# Patient Record
Sex: Female | Born: 1935 | Race: White | Hispanic: No | Marital: Married
Health system: Southern US, Community
[De-identification: ages and names within clinical notes are randomized; demographics above are authoritative.]

## PROBLEM LIST (undated history)

## (undated) DIAGNOSIS — E785 Hyperlipidemia, unspecified: Secondary | ICD-10-CM

## (undated) DIAGNOSIS — M4800 Spinal stenosis, site unspecified: Secondary | ICD-10-CM

## (undated) DIAGNOSIS — F0280 Dementia in other diseases classified elsewhere without behavioral disturbance: Secondary | ICD-10-CM

## (undated) DIAGNOSIS — N189 Chronic kidney disease, unspecified: Secondary | ICD-10-CM

---

## 2020-03-04 ENCOUNTER — Other Ambulatory Visit: Payer: Self-pay

## 2020-03-04 ENCOUNTER — Emergency Department (HOSPITAL_BASED_OUTPATIENT_CLINIC_OR_DEPARTMENT_OTHER)
Admission: EM | Admit: 2020-03-04 | Discharge: 2020-03-05 | Disposition: A | Discharge status: Home / Self Care | Payer: Medicare Other | Attending: Emergency Medicine | Admitting: Emergency Medicine

## 2020-03-04 ENCOUNTER — Emergency Department (HOSPITAL_BASED_OUTPATIENT_CLINIC_OR_DEPARTMENT_OTHER): Payer: Medicare Other

## 2020-03-04 ENCOUNTER — Encounter (HOSPITAL_BASED_OUTPATIENT_CLINIC_OR_DEPARTMENT_OTHER): Payer: Self-pay

## 2020-03-04 DIAGNOSIS — S0083XA Contusion of other part of head, initial encounter: Secondary | ICD-10-CM | POA: Diagnosis present

## 2020-03-04 DIAGNOSIS — S0003XA Contusion of scalp, initial encounter: Secondary | ICD-10-CM | POA: Insufficient documentation

## 2020-03-04 DIAGNOSIS — F0281 Dementia in other diseases classified elsewhere with behavioral disturbance: Secondary | ICD-10-CM | POA: Insufficient documentation

## 2020-03-04 DIAGNOSIS — Y9239 Other specified sports and athletic area as the place of occurrence of the external cause: Secondary | ICD-10-CM | POA: Insufficient documentation

## 2020-03-04 DIAGNOSIS — W1839XA Other fall on same level, initial encounter: Secondary | ICD-10-CM | POA: Insufficient documentation

## 2020-03-04 DIAGNOSIS — R300 Dysuria: Secondary | ICD-10-CM | POA: Insufficient documentation

## 2020-03-04 DIAGNOSIS — Y9389 Activity, other specified: Secondary | ICD-10-CM | POA: Diagnosis not present

## 2020-03-04 DIAGNOSIS — G309 Alzheimer's disease, unspecified: Secondary | ICD-10-CM | POA: Diagnosis not present

## 2020-03-04 DIAGNOSIS — W19XXXA Unspecified fall, initial encounter: Secondary | ICD-10-CM

## 2020-03-04 HISTORY — DX: Dementia in other diseases classified elsewhere, unspecified severity, without behavioral disturbance, psychotic disturbance, mood disturbance, and anxiety: F02.80

## 2020-03-04 HISTORY — DX: Chronic kidney disease, unspecified: N18.9

## 2020-03-04 HISTORY — DX: Spinal stenosis, site unspecified: M48.00

## 2020-03-04 HISTORY — DX: Hyperlipidemia, unspecified: E78.5

## 2020-03-04 LAB — URINALYSIS, MICROSCOPIC (REFLEX): RBC / HPF: NONE SEEN RBC/hpf (ref 0–5)

## 2020-03-04 LAB — URINALYSIS, ROUTINE W REFLEX MICROSCOPIC
Bilirubin Urine: NEGATIVE
Glucose, UA: NEGATIVE mg/dL
Hgb urine dipstick: NEGATIVE
Ketones, ur: NEGATIVE mg/dL
Leukocytes,Ua: NEGATIVE
Nitrite: NEGATIVE
Protein, ur: 30 mg/dL — AB
Specific Gravity, Urine: >1.03 — ABNORMAL HIGH (ref 1.005–1.030)
pH: 6 (ref 5.0–8.0)

## 2020-03-04 NOTE — ED Provider Notes (Signed)
Emergency Department Provider Note   I have reviewed the triage vital signs and the nursing notes.   HISTORY  Chief Complaint Fall and Urinary Tract Infection   HPI Madeline Richardson is a 86 y.o. female with PMH of dementia from [facility] memory care unit presents to the ED after being found down on the ground.  Patient has dementia and does not recall the incident.  Staff found her on the floor and noted a hematoma to the forehead.  EMS was called for transport.  Per EMS, staff requesting evaluation for urinary tract infection but no report of fever or other specific symptoms.  Patient is reportedly at her mental status baseline per EMS.   Level 5 caveat: Dementia   Past Medical History:  Diagnosis Date  . Alzheimer's dementia (HCC)   . Chronic kidney disease   . Hyperlipidemia   . Spinal stenosis     There are no problems to display for this patient.  Allergies Patient has no known allergies.  No family history on file.  Social History Social History   Tobacco Use  . Smoking status: Unknown If Ever Smoked  Vaping Use  . Vaping Use: Unknown  Substance Use Topics  . Alcohol use: Not on file  . Drug use: Not on file    Review of Systems  Level 5 caveat: Dementia  ____________________________________________   PHYSICAL EXAM:  VITAL SIGNS: ED Triage Vitals  Enc Vitals Group     BP 03/04/20 2103 (!) 153/78     Pulse Rate 03/04/20 2103 65     Resp 03/04/20 2103 16     Temp 03/04/20 2103 98.1 F (36.7 C)     Temp Source 03/04/20 2103 Oral     SpO2 03/04/20 2059 95 %   Constitutional: Alert and pleasantly confused. Well appearing and in no acute distress. Eyes: Conjunctivae are normal. Head: 3 x 4 cm hematoma without laceration to the right forehead.  Nose: No congestion/rhinnorhea. Mouth/Throat: Mucous membranes are moist.   Neck: No stridor.   Cardiovascular: Normal rate, regular rhythm. Good peripheral circulation. Grossly normal heart sounds.     Respiratory: Normal respiratory effort.  No retractions. Lungs CTAB. Gastrointestinal: Soft and nontender. No distention.  Musculoskeletal: No lower extremity tenderness nor edema. No gross deformities of extremities. Normal active and passive ROM of bilateral upper and lower extremities including hips.  Neurologic:  Normal speech and language. No gross focal neurologic deficits are appreciated.  Skin:  Skin is warm, dry and intact. No rash noted.   ____________________________________________   LABS (all labs ordered are listed, but only abnormal results are displayed)  Labs Reviewed  URINALYSIS, ROUTINE W REFLEX MICROSCOPIC - Abnormal; Notable for the following components:      Result Value   APPearance HAZY (*)    Specific Gravity, Urine >1.030 (*)    Protein, ur 30 (*)    All other components within normal limits  URINALYSIS, MICROSCOPIC (REFLEX) - Abnormal; Notable for the following components:   Bacteria, UA FEW (*)    All other components within normal limits  URINE CULTURE   ____________________________________________  RADIOLOGY  CT Head Wo Contrast  Result Date: 03/04/2020 CLINICAL DATA:  86 year old female with head trauma. EXAM: CT HEAD WITHOUT CONTRAST CT CERVICAL SPINE WITHOUT CONTRAST TECHNIQUE: Multidetector CT imaging of the head and cervical spine was performed following the standard protocol without intravenous contrast. Multiplanar CT image reconstructions of the cervical spine were also generated. COMPARISON:  None. FINDINGS: CT HEAD FINDINGS  Brain: Mild age-related atrophy and chronic microvascular ischemic changes. Postsurgical changes of the left temporal lobe with encephalomalacia. There is no acute intracranial hemorrhage. No mass effect midline shift no extra-axial fluid collection. Vascular: No hyperdense vessel or unexpected calcification. Skull: Left temporal craniotomy. No acute osseous pathology. Sinuses/Orbits: No acute finding. Other: Minimal right  forehead contusion. CT CERVICAL SPINE FINDINGS Alignment: No acute subluxation. Grade 1 C4-C5 anterolisthesis. Skull base and vertebrae: No acute fracture. Osteopenia. Soft tissues and spinal canal: No prevertebral fluid or swelling. No visible canal hematoma. Disc levels: Multilevel degenerative changes with endplate irregularity and disc space narrowing and osteophyte primarily at C5 C6 and C6-C7. Upper chest: Negative. Other: None IMPRESSION: 1. No acute intracranial pathology. 2. Mild age-related atrophy and chronic microvascular ischemic changes. Postsurgical changes of the left temporal lobe with encephalomalacia. 3. No acute/traumatic cervical spine pathology. Multilevel degenerative changes. Electronically Signed   By: Elgie Collard M.D.   On: 03/04/2020 21:43   CT Cervical Spine Wo Contrast  Result Date: 03/04/2020 CLINICAL DATA:  86 year old female with head trauma. EXAM: CT HEAD WITHOUT CONTRAST CT CERVICAL SPINE WITHOUT CONTRAST TECHNIQUE: Multidetector CT imaging of the head and cervical spine was performed following the standard protocol without intravenous contrast. Multiplanar CT image reconstructions of the cervical spine were also generated. COMPARISON:  None. FINDINGS: CT HEAD FINDINGS Brain: Mild age-related atrophy and chronic microvascular ischemic changes. Postsurgical changes of the left temporal lobe with encephalomalacia. There is no acute intracranial hemorrhage. No mass effect midline shift no extra-axial fluid collection. Vascular: No hyperdense vessel or unexpected calcification. Skull: Left temporal craniotomy. No acute osseous pathology. Sinuses/Orbits: No acute finding. Other: Minimal right forehead contusion. CT CERVICAL SPINE FINDINGS Alignment: No acute subluxation. Grade 1 C4-C5 anterolisthesis. Skull base and vertebrae: No acute fracture. Osteopenia. Soft tissues and spinal canal: No prevertebral fluid or swelling. No visible canal hematoma. Disc levels: Multilevel  degenerative changes with endplate irregularity and disc space narrowing and osteophyte primarily at C5 C6 and C6-C7. Upper chest: Negative. Other: None IMPRESSION: 1. No acute intracranial pathology. 2. Mild age-related atrophy and chronic microvascular ischemic changes. Postsurgical changes of the left temporal lobe with encephalomalacia. 3. No acute/traumatic cervical spine pathology. Multilevel degenerative changes. Electronically Signed   By: Elgie Collard M.D.   On: 03/04/2020 21:43    ____________________________________________   PROCEDURES  Procedure(s) performed:   Procedures  None  ____________________________________________   INITIAL IMPRESSION / ASSESSMENT AND PLAN / ED COURSE  Pertinent labs & imaging results that were available during my care of the patient were reviewed by me and considered in my medical decision making (see chart for details).   Patient presents to the emergency department for evaluation of fall at the memory care unit.  She has normal range of motion of her arms and legs without apparent discomfort.  She does have a hematoma to the forehead and will obtain CT scan of the head and cervical spine.  No other apparent areas of pain on exam.  We will also send UA.   CT imaging of the head and cervical spine reviewed with no acute findings.  Patient's UA is not consistent with UTI but will send for culture.  Patient appears to be at her mental status baseline.  Plan for discharge back to the memory care unit. Instructions and w/u results discussed in AVS for staff and family.  ____________________________________________  FINAL CLINICAL IMPRESSION(S) / ED DIAGNOSES  Final diagnoses:  Fall, initial encounter  Hematoma of scalp, initial encounter  Note:  This document was prepared using Dragon voice recognition software and may include unintentional dictation errors.  Alona Bene, MD, [facility]    Carisa Backhaus, Arlyss Repress, MD 03/04/20  [phone]

## 2020-03-04 NOTE — Discharge Instructions (Signed)
You were seen in the emergency room today after a fall.  The CT scan of your head and neck look normal.  He did not have a urinary tract infection but we are sending this for culture and you will be called if bacteria grow that require antibiotics.  Please follow closely with your primary care doctor.

## 2020-03-04 NOTE — ED Triage Notes (Signed)
Patient arrived via EMS c/o fall at facility. Per EMS patient found on floor of memory care unit of [place] at [facility] with hematoma on forehead. Patient has hx of dementia. Patient denies pain at this time. Per facility request, would like patient evaluated for UTI. Patient is AO at baseline, w/ elevated BP, unsteady gait.

## 2020-03-05 NOTE — ED Notes (Signed)
Patient has left hearing aide in ear; right hearing aide placed in specimen cup and labeled with patient's name on bottle and placed in belonging bag with shoes in bag and patient label on bag as well.

## 2020-03-05 NOTE — ED Notes (Signed)
Pt is awaiting transport back to her facility.

## 2020-03-06 LAB — URINE CULTURE: Culture: NO GROWTH

## 2020-07-31 DEATH — deceased

## 2022-05-05 IMAGING — CT CT CERVICAL SPINE W/O CM
3 of 4 series · 13 of 33 positions shown, 16 images · non-contrast
Comparison: None.

CLINICAL DATA: [AGE] female with head trauma.

EXAM:
CT HEAD WITHOUT CONTRAST
CT CERVICAL SPINE WITHOUT CONTRAST
TECHNIQUE: Multidetector CT imaging of the head and cervical spine was
performed following the standard protocol without intravenous
contrast. Multiplanar CT image reconstructions of the cervical spine
were also generated.

[Series 3: c_spine 2.0 i30s 3 · axial · 0.35mm/px · z∈[-196,-98]mm · 5 of 75 slices shown, 7 images]
[im 13/75  soft-tissue]
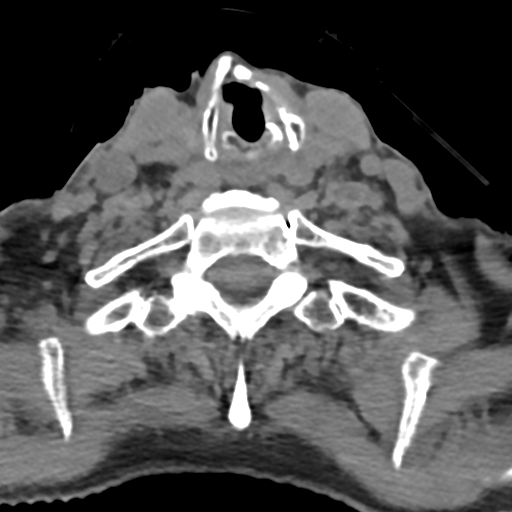
[im 13/75  bone]
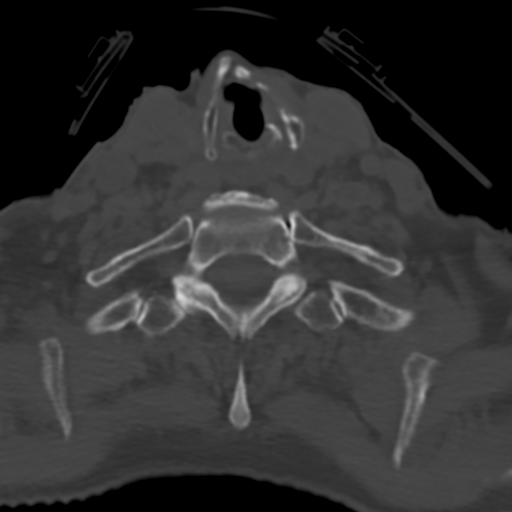
[im 25/75  bone]
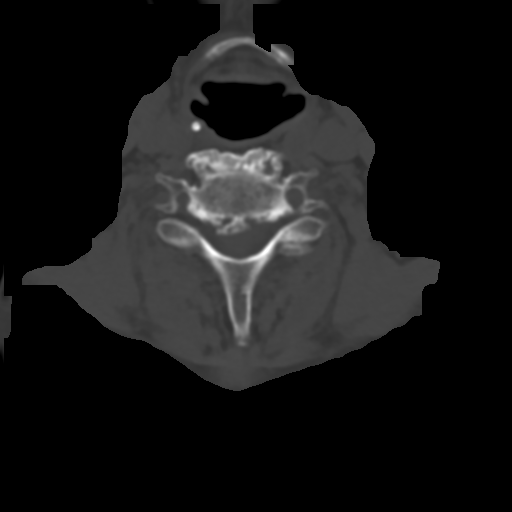
[im 38/75  bone]
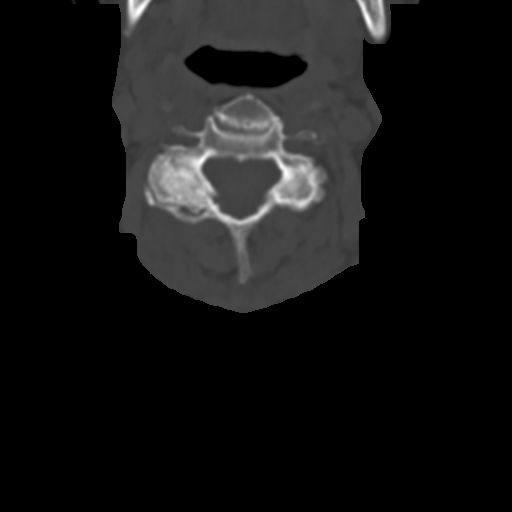
[im 50/75  bone]
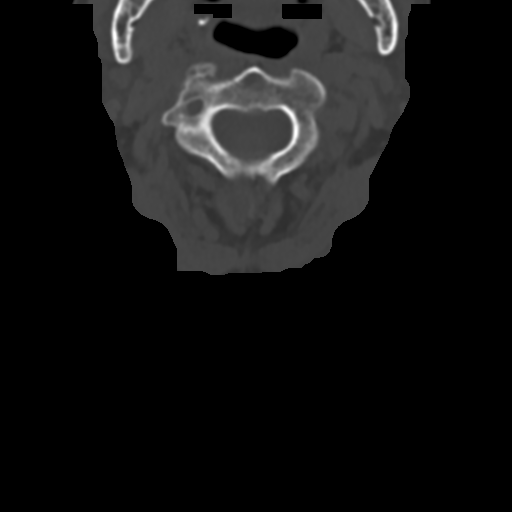
[im 62/75  soft-tissue]
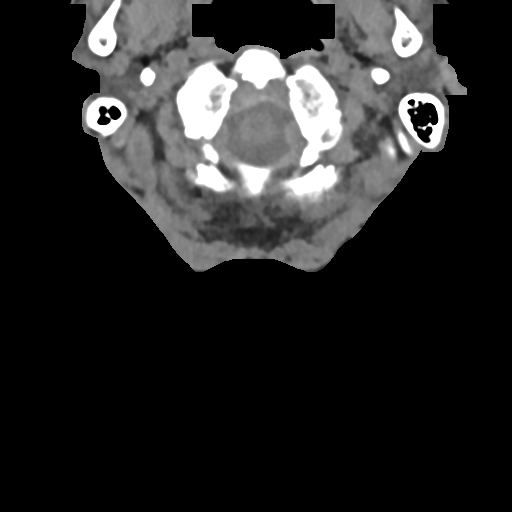
[im 62/75  bone]
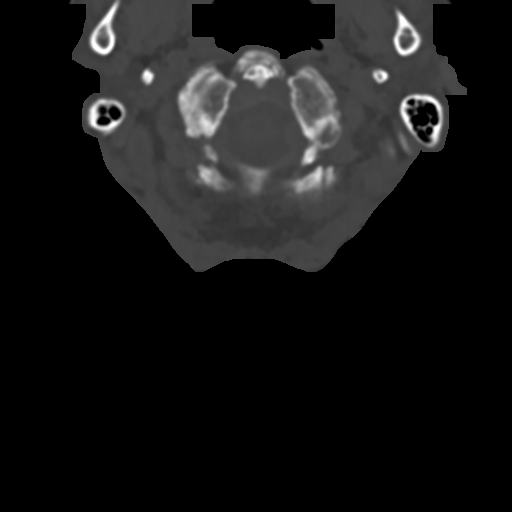

[Series 5: coronals · coronal · 0.23mm/px · 3 of 49 slices shown]
[im 10/49  bone]
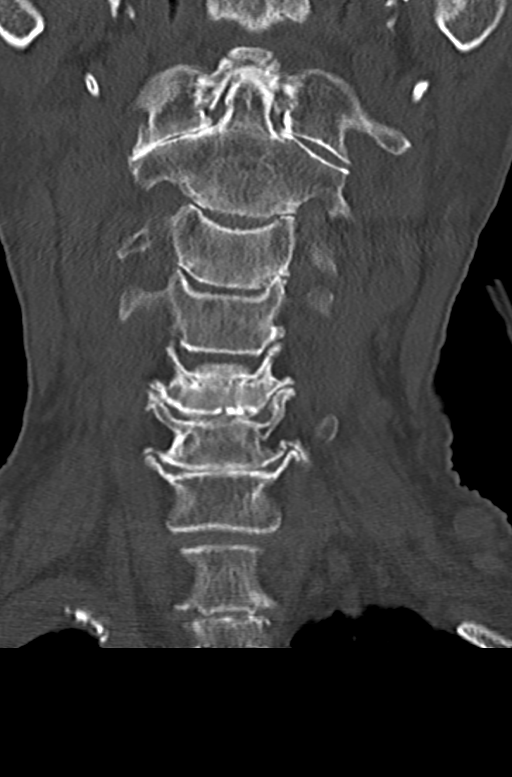
[im 20/49  bone]
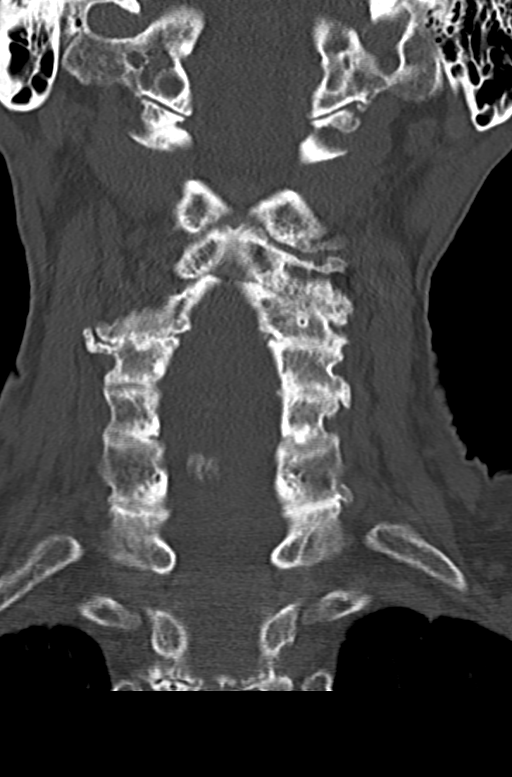
[im 29/49  bone]
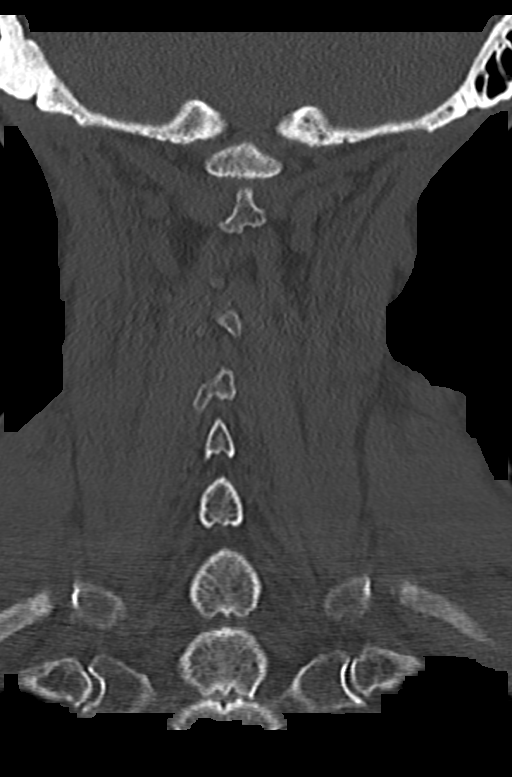

[Series 6: sagittals · sagittal · 0.32mm/px · 5 of 56 slices shown, 6 images]
[im 19/56  bone]
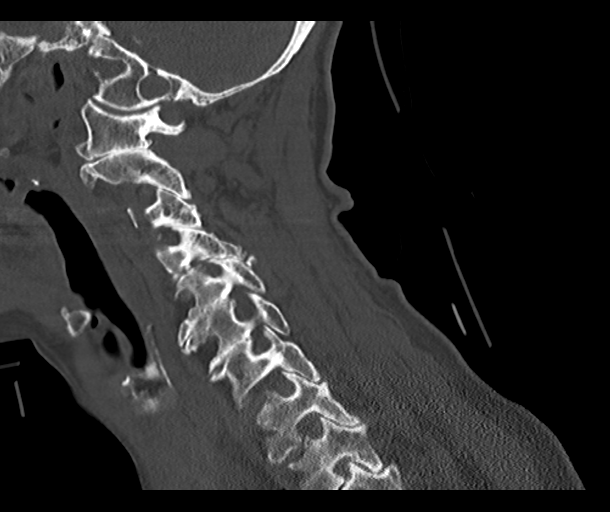
[im 23/56  bone]
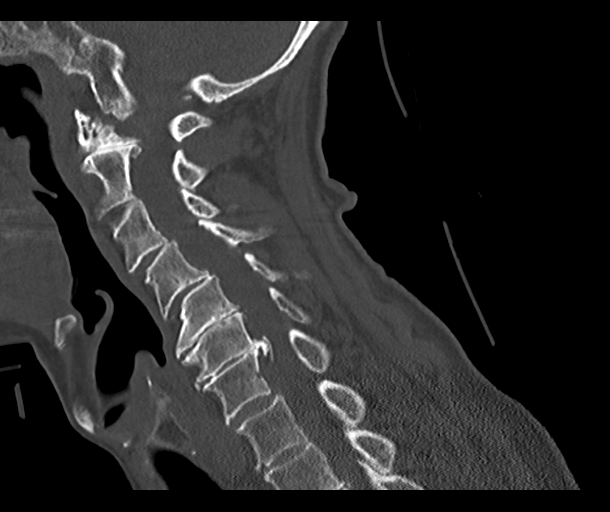
[im 28/56  soft-tissue]
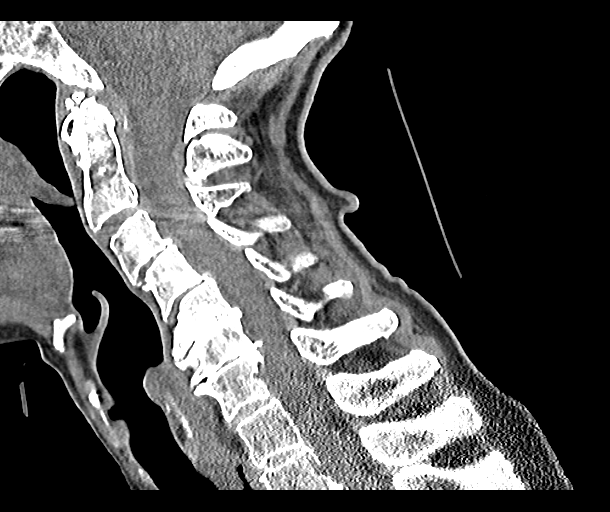
[im 28/56  bone]
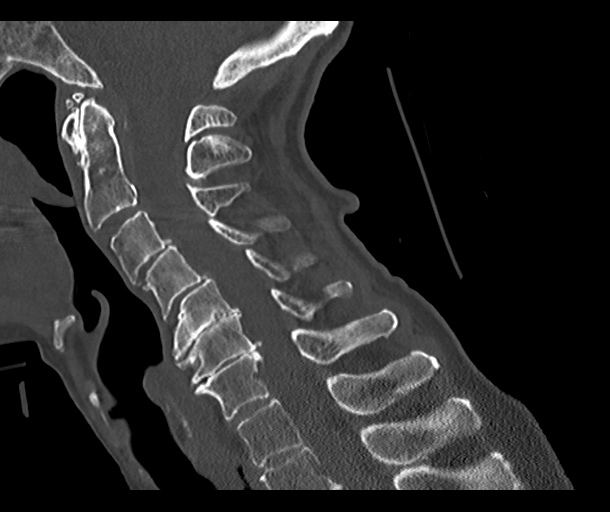
[im 33/56  bone]
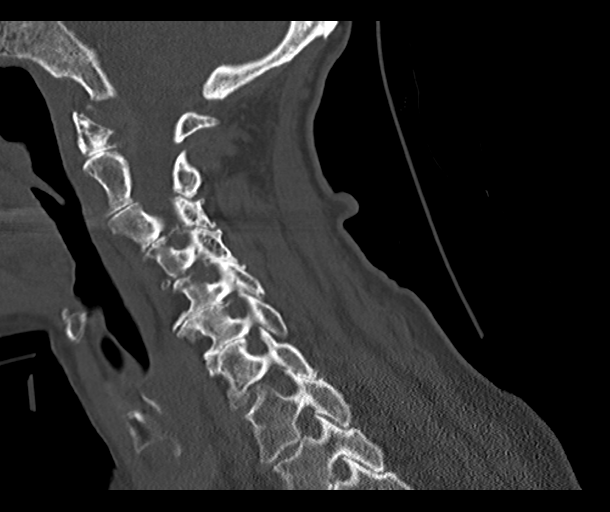
[im 37/56  bone]
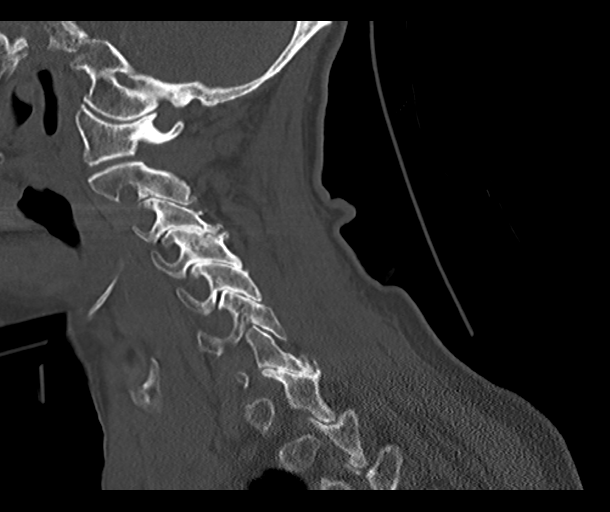

[13 of 33 positions shown; findings below may reference images not displayed]

FINDINGS: CT HEAD FINDINGS

Brain: Mild age-related atrophy and chronic microvascular ischemic
changes. Postsurgical changes of the left temporal lobe with
encephalomalacia. There is no acute intracranial hemorrhage. No mass
effect midline shift no extra-axial fluid collection.

Vascular: No hyperdense vessel or unexpected calcification.

Skull: Left temporal craniotomy. No acute osseous pathology.

Sinuses/Orbits: No acute finding.

Other: Minimal right forehead contusion.

CT CERVICAL SPINE FINDINGS

Alignment: No acute subluxation. Grade 1 C4-C5 anterolisthesis.

Skull base and vertebrae: No acute fracture. Osteopenia.

Soft tissues and spinal canal: No prevertebral fluid or swelling. No
visible canal hematoma.

Disc levels: Multilevel degenerative changes with endplate
irregularity and disc space narrowing and osteophyte primarily at C5
C6 and C6-C7.

Upper chest: Negative.

Other: None
IMPRESSION: 1. No acute intracranial pathology.
2. Mild age-related atrophy and chronic microvascular ischemic
changes. Postsurgical changes of the left temporal lobe with
encephalomalacia.
3. No acute/traumatic cervical spine pathology. Multilevel
degenerative changes.

## 2022-05-05 IMAGING — CT CT HEAD W/O CM
3 series · 14 of 47 positions shown, 16 images · non-contrast
Comparison: None.

CLINICAL DATA: [AGE] female with head trauma.

EXAM:
CT HEAD WITHOUT CONTRAST
CT CERVICAL SPINE WITHOUT CONTRAST
TECHNIQUE: Multidetector CT imaging of the head and cervical spine was
performed following the standard protocol without intravenous
contrast. Multiplanar CT image reconstructions of the cervical spine
were also generated.

[Series 2: head 5.0 h30s · axial · 0.41mm/px · z∈[-92,+34]mm · 8 of 31 slices shown, 10 images]
[im 3/31  brain]
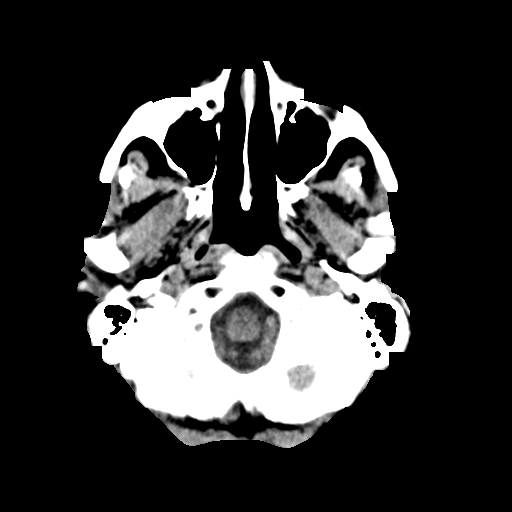
[im 3/31  bone]
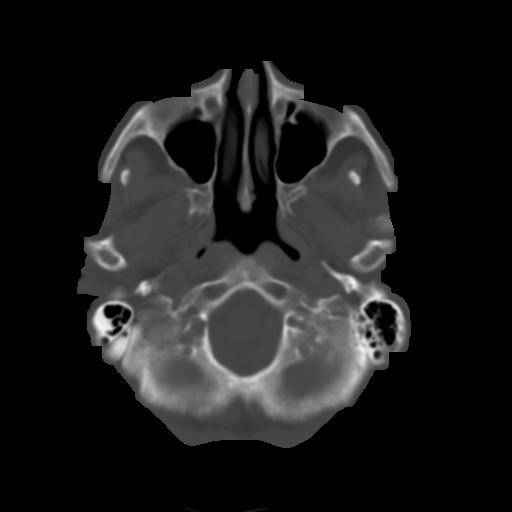
[im 7/31  brain]
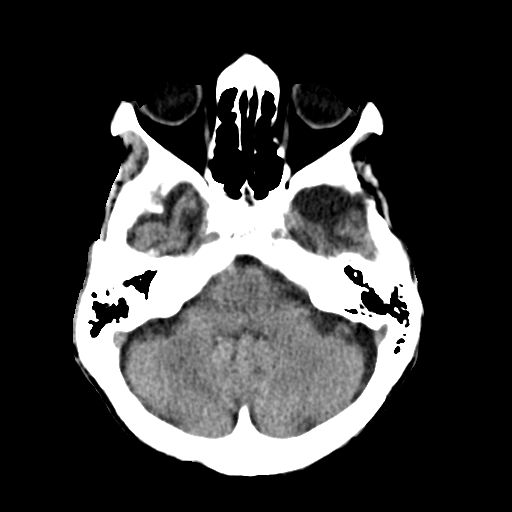
[im 10/31  brain]
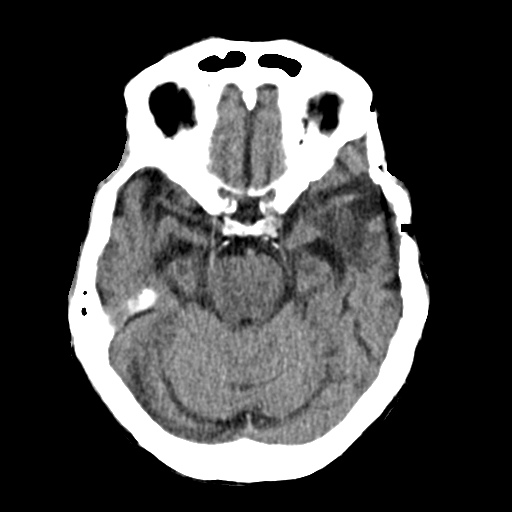
[im 14/31  brain]
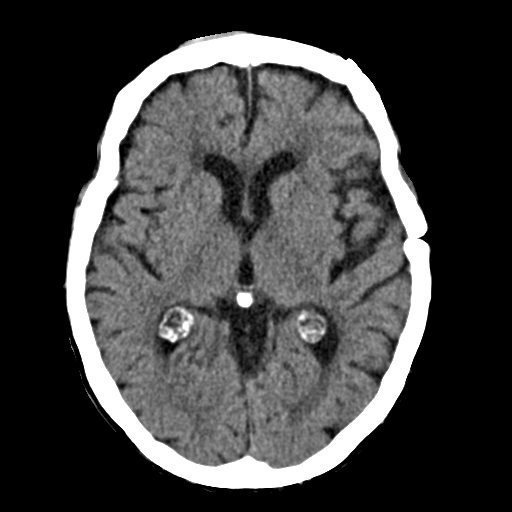
[im 17/31  brain]
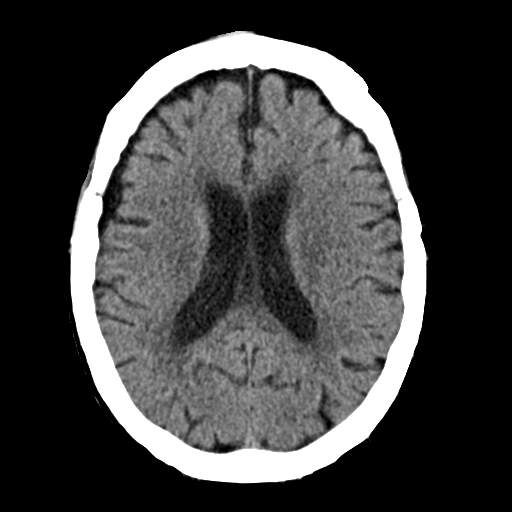
[im 17/31  bone]
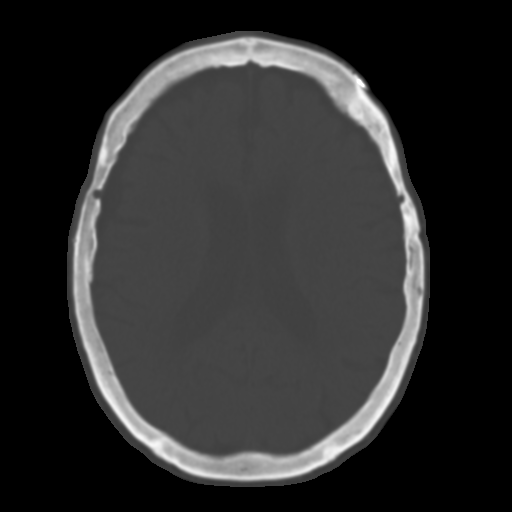
[im 21/31  brain]
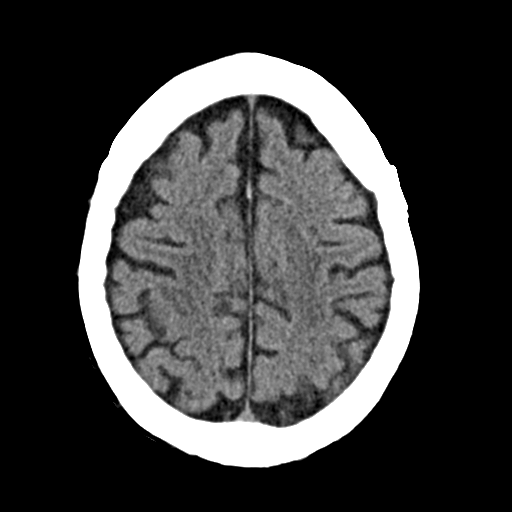
[im 24/31  brain]
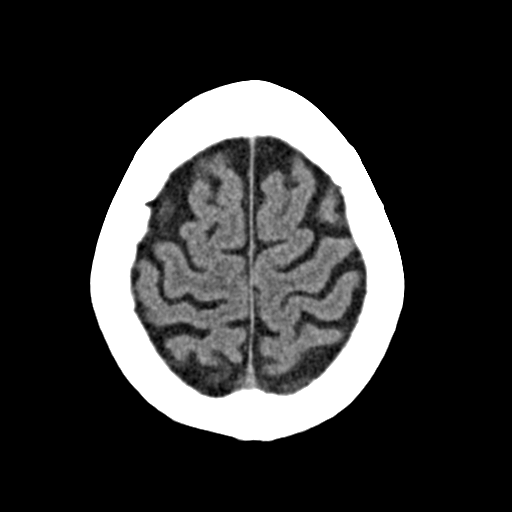
[im 28/31  brain]
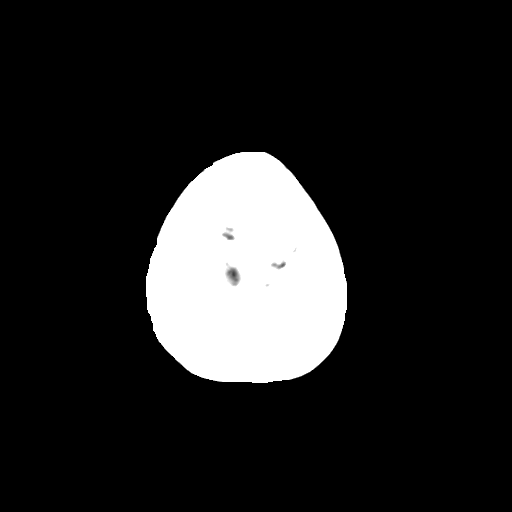

[Series 4: head 3.0 mpr cor · coronal · 0.30mm/px · 3 of 69 slices shown]
[im 23/69  brain]
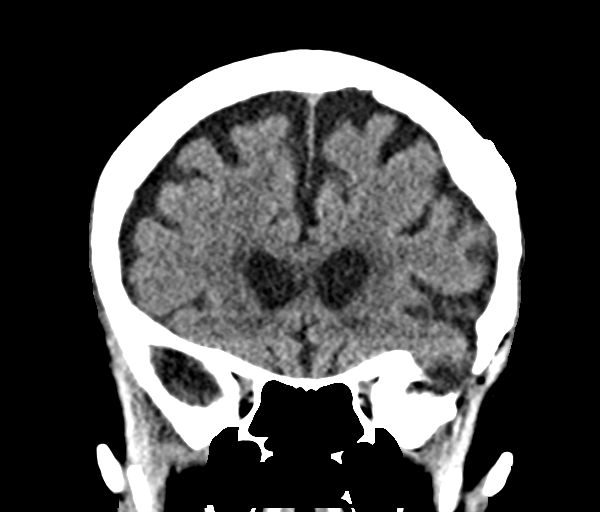
[im 31/69  brain]
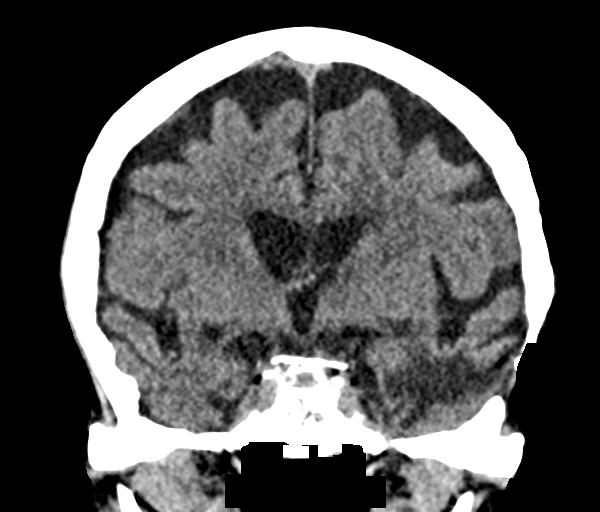
[im 38/69  brain]
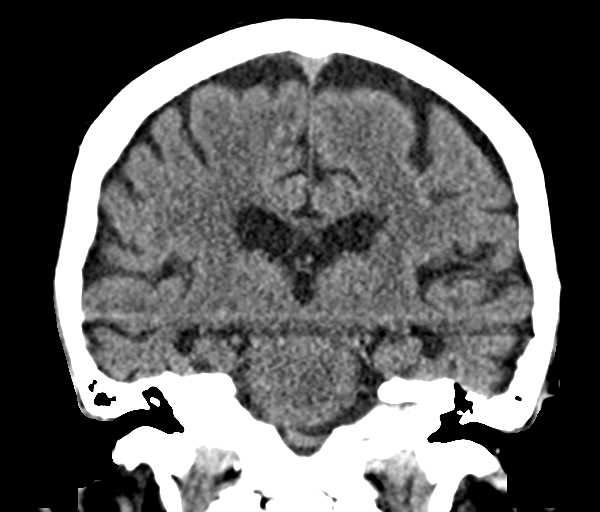

[Series 5: head 3.0 mpr sag · sagittal · 0.31mm/px · 3 of 55 slices shown]
[im 19/55  brain]
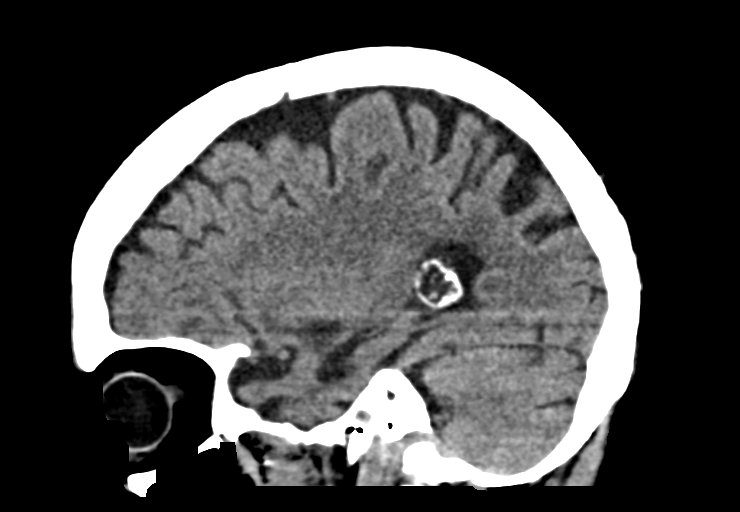
[im 28/55  brain]
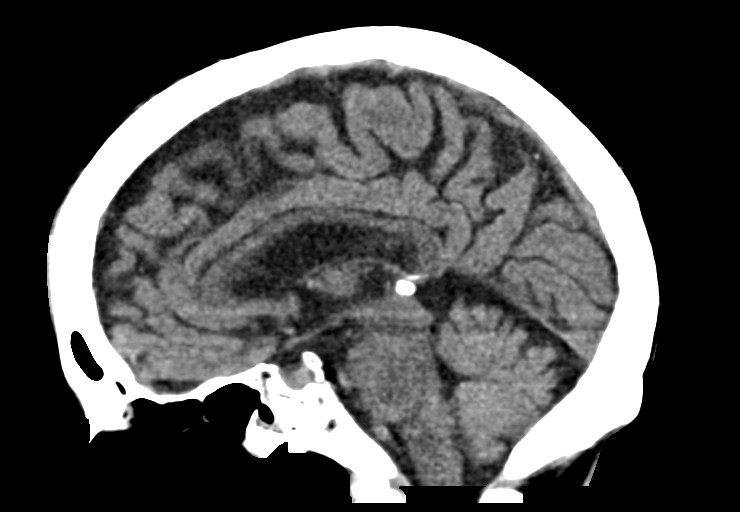
[im 37/55  brain]
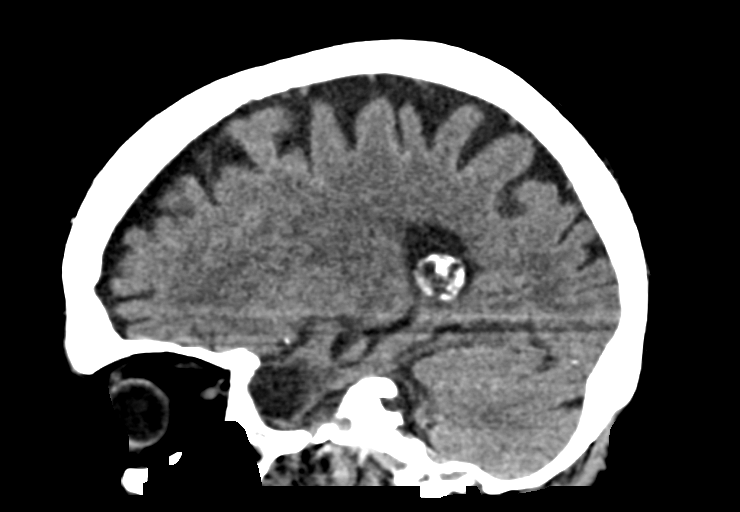

[14 of 47 positions shown; findings below may reference images not displayed]

FINDINGS: CT HEAD FINDINGS

Brain: Mild age-related atrophy and chronic microvascular ischemic
changes. Postsurgical changes of the left temporal lobe with
encephalomalacia. There is no acute intracranial hemorrhage. No mass
effect midline shift no extra-axial fluid collection.

Vascular: No hyperdense vessel or unexpected calcification.

Skull: Left temporal craniotomy. No acute osseous pathology.

Sinuses/Orbits: No acute finding.

Other: Minimal right forehead contusion.

CT CERVICAL SPINE FINDINGS

Alignment: No acute subluxation. Grade 1 C4-C5 anterolisthesis.

Skull base and vertebrae: No acute fracture. Osteopenia.

Soft tissues and spinal canal: No prevertebral fluid or swelling. No
visible canal hematoma.

Disc levels: Multilevel degenerative changes with endplate
irregularity and disc space narrowing and osteophyte primarily at C5
C6 and C6-C7.

Upper chest: Negative.

Other: None
IMPRESSION: 1. No acute intracranial pathology.
2. Mild age-related atrophy and chronic microvascular ischemic
changes. Postsurgical changes of the left temporal lobe with
encephalomalacia.
3. No acute/traumatic cervical spine pathology. Multilevel
degenerative changes.
